# Patient Record
Sex: Female | Born: 1980 | Race: Black or African American | Hispanic: No | Marital: Single | Smoking: Current every day smoker
Health system: Southern US, Community
[De-identification: ages and names within clinical notes are randomized; demographics above are authoritative.]

## PROBLEM LIST (undated history)

## (undated) DIAGNOSIS — B009 Herpesviral infection, unspecified: Secondary | ICD-10-CM

## (undated) HISTORY — DX: Herpesviral infection, unspecified: B00.9

---

## 2004-08-22 ENCOUNTER — Ambulatory Visit (HOSPITAL_COMMUNITY): Admission: RE | Admit: 2004-08-22 | Discharge: 2004-08-22 | Payer: Self-pay | Admitting: Internal Medicine

## 2004-12-02 ENCOUNTER — Encounter: Admission: RE | Admit: 2004-12-02 | Discharge: 2004-12-25 | Payer: Self-pay | Admitting: *Deleted

## 2005-01-18 ENCOUNTER — Ambulatory Visit: Payer: Self-pay | Admitting: Certified Nurse Midwife

## 2005-01-18 ENCOUNTER — Inpatient Hospital Stay (HOSPITAL_COMMUNITY): Admission: AD | Admit: 2005-01-18 | Discharge: 2005-01-20 | Payer: Self-pay | Admitting: Family Medicine

## 2006-03-15 ENCOUNTER — Emergency Department (HOSPITAL_COMMUNITY): Admission: EM | Admit: 2006-03-15 | Discharge: 2006-03-15 | Payer: Self-pay | Admitting: Emergency Medicine

## 2006-07-15 IMAGING — US US OB COMP +14 WK
1 series · 13 of 28 positions shown · non-contrast
Comparison: none

CLINICAL DATA: 18 week 0 day gestational age by LMP.  Evaluate anatomy and dating.

[Series 1: us ob comp +14 wk · 0.20mm/px · 13 of 91 slices shown]
[im 4/91]
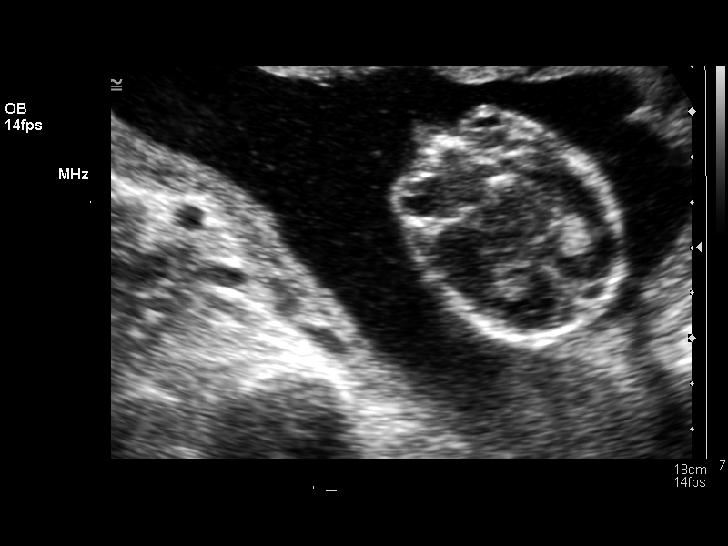
[im 11/91]
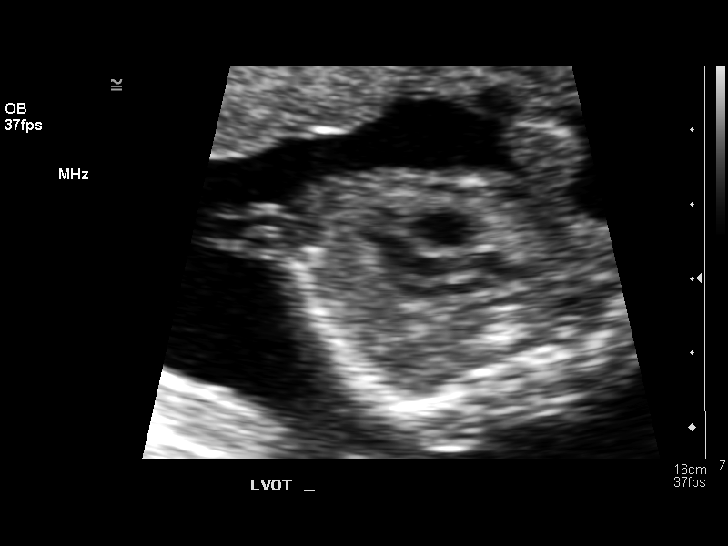
[im 17/91]
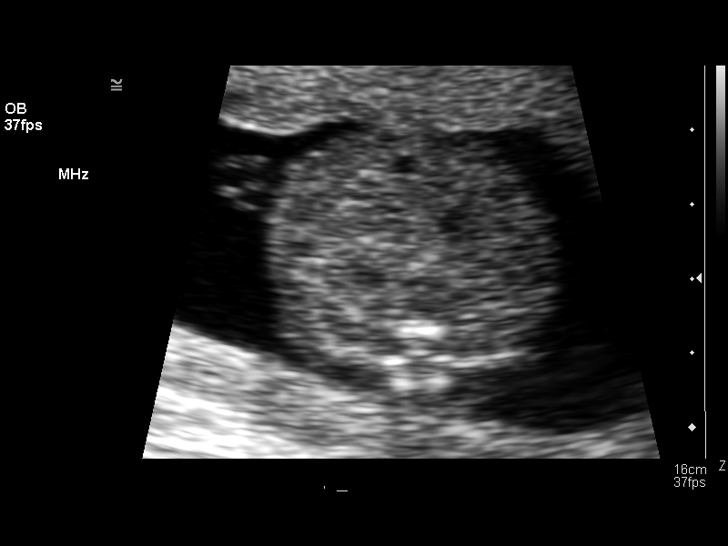
[im 24/91]
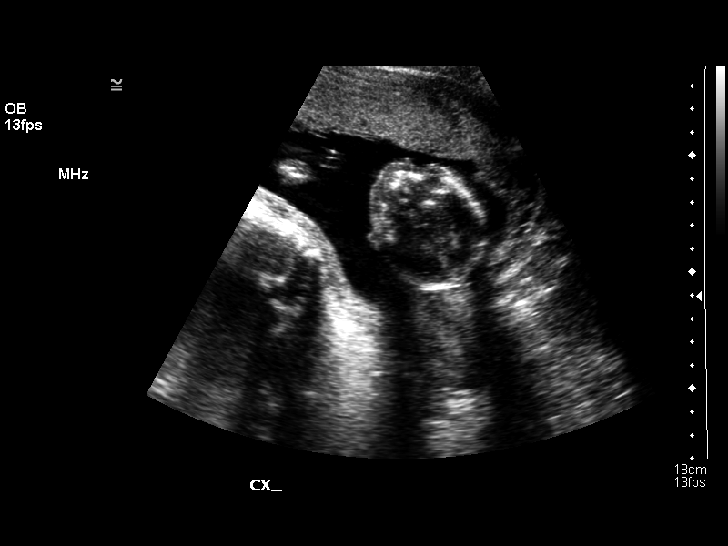
[im 31/91]
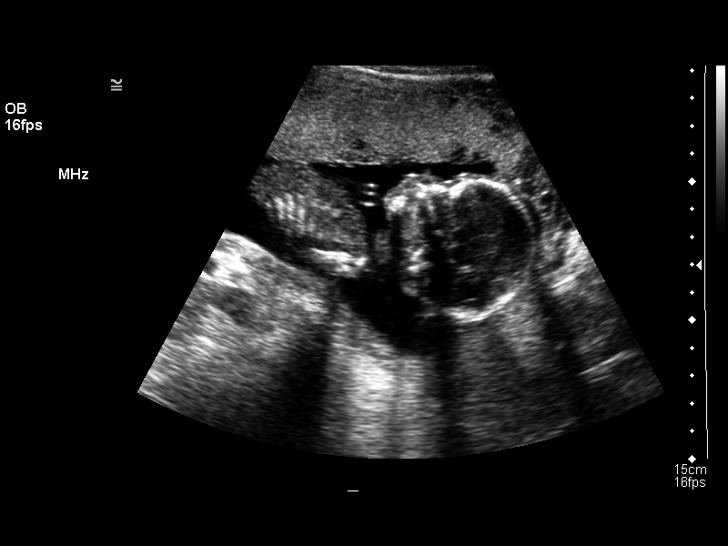
[im 37/91]
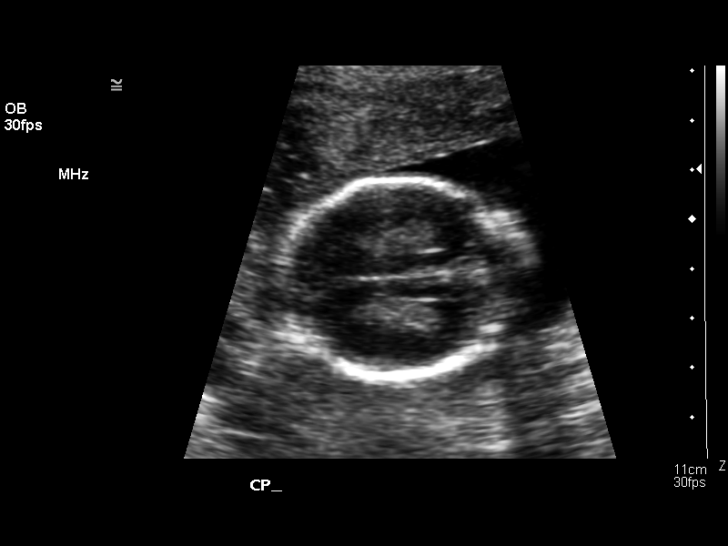
[im 47/91]
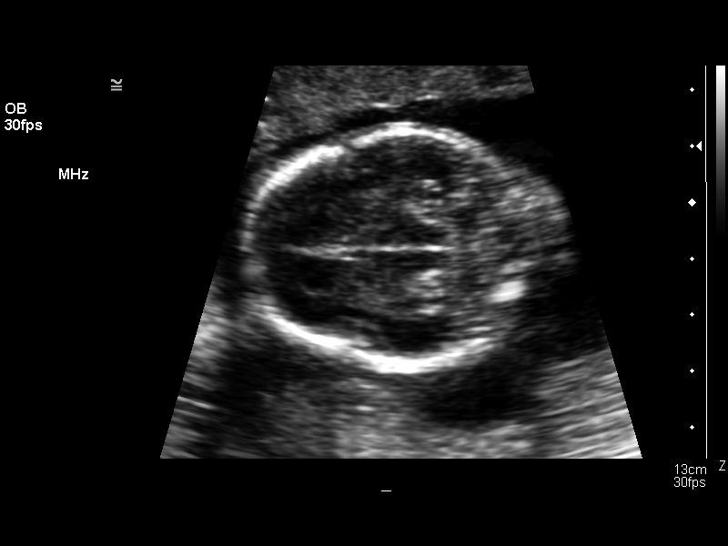
[im 54/91]
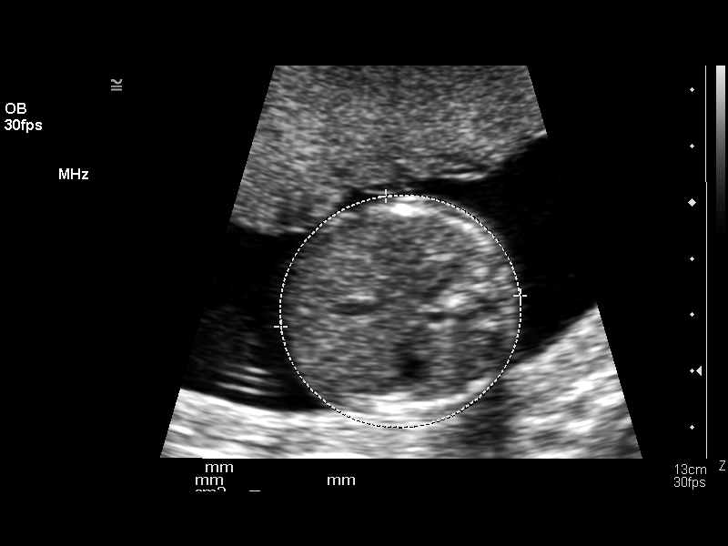
[im 61/91]
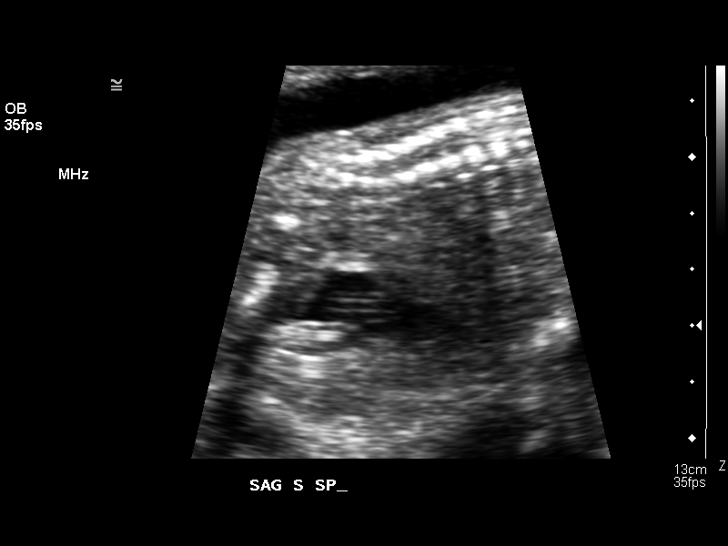
[im 67/91]
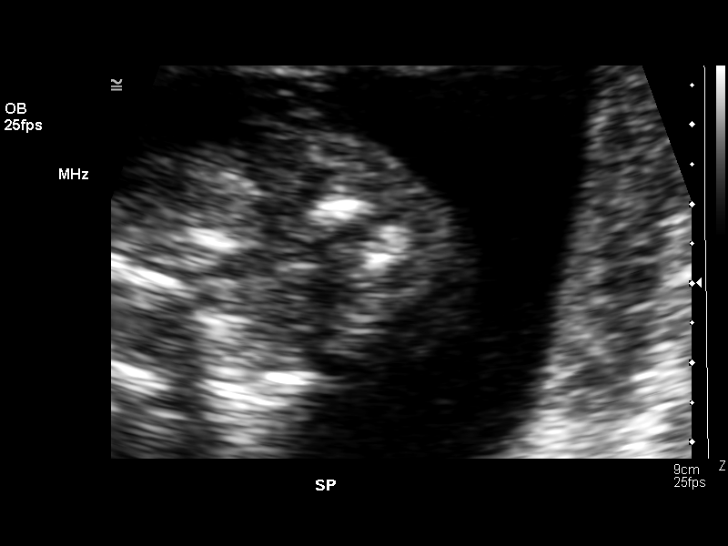
[im 74/91]
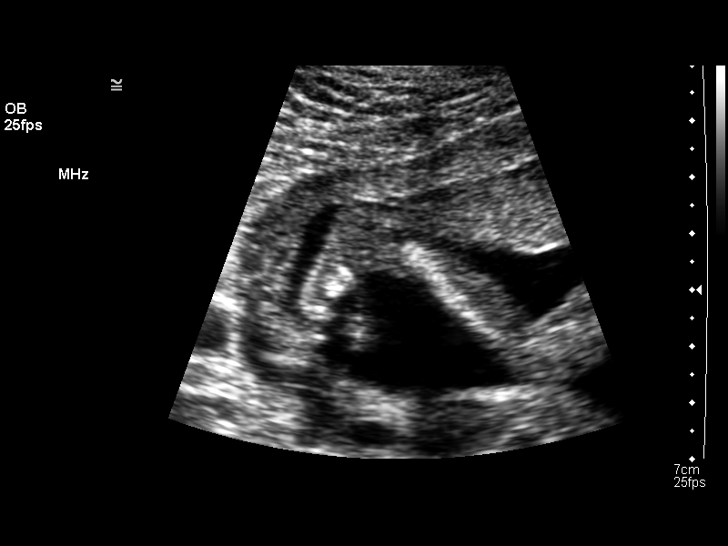
[im 81/91]
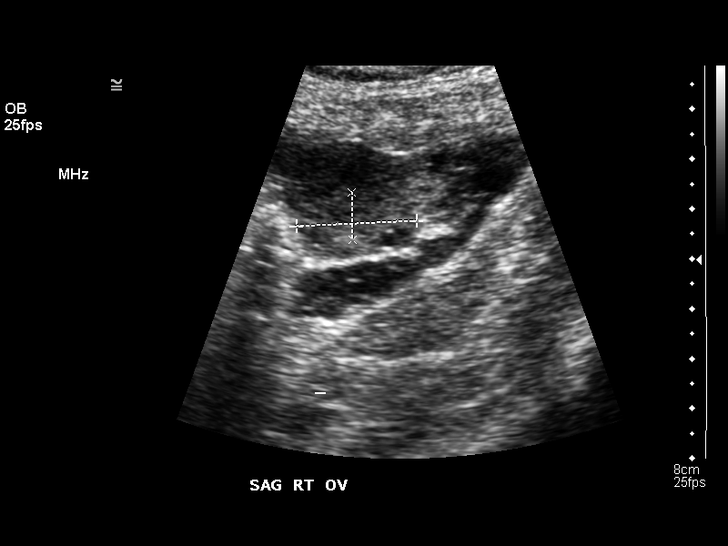
[im 87/91]
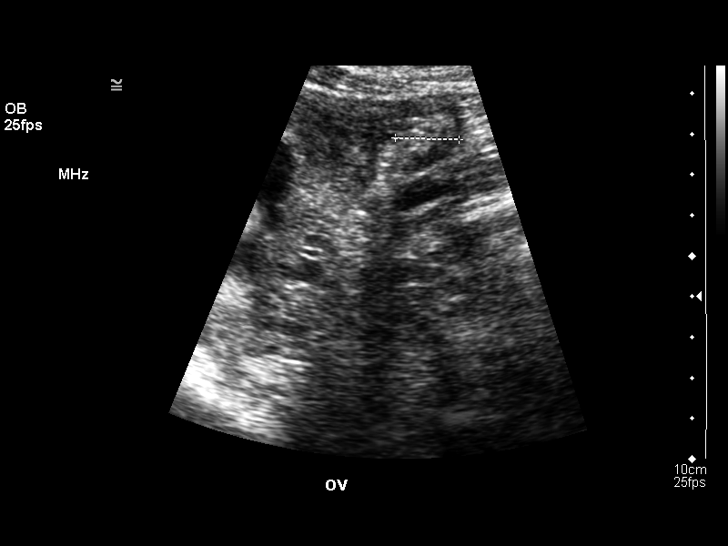

[13 of 28 positions shown; findings below may reference images not displayed]

OBSTETRICAL ULTRASOUND:
 Number of Fetuses:  1
 Heart Rate:  147
 Movement:  Yes
 Breathing:  Yes  
 Presentation:  Cephalic
 Placental Location:  Anterior
 Grade:  I
 Previa:  No
 Amniotic Fluid (Subjective):  Normal
 Amniotic Fluid (Objective):   3.3 cm Vertical pocket 

 FETAL BIOMETRY
 BPD:   4.2 cm   18 w 4 d
 HC:   15.8 cm   18 w 5 d
 AC:   13.1 cm   18 w 4 d
 FL:    3.0 cm   19 w 2 d

 MEAN GA:  18 w 6 d

 FETAL ANATOMY
 Lateral Ventricles:    Visualized 
 Thalami/CSP:      Visualized 
 Posterior Fossa:  Visualized 
 Nuchal Region:    Visualized 
 Spine:      Visualized 
 4 Chamber Heart on Left:      Visualized 
 Stomach on Left:      Visualized 
 3 Vessel Cord:    Visualized 
 Cord Insertion site:    Visualized 
 Kidneys:  Visualized 
 Bladder:  Visualized 
 Extremities:      Visualized 

 ADDITIONAL ANATOMY VISUALIZED:  LVOT, RVOT, upper lip, orbits, profile, diaphragm, heel, 5th digit, ductal arch, aortic arch, and female genitalia.

 MATERNAL UTERINE AND ADNEXAL FINDINGS
 Cervix:   3.4 cm Transabdominally.  
 Both ovaries are unremarkable in appearance.  Incidental note is made of a small, approximately 1 cm, right ovarian corpus luteum cyst.
IMPRESSION: 1.  Single living intrauterine fetus with mean gestational age of 18 weeks 6 days and sonographic EDC 01/17/05.  This is within one week of LMP.  
 2.  No evidence of fetal anatomic abnormality.

## 2010-12-15 ENCOUNTER — Emergency Department (HOSPITAL_COMMUNITY)
Admission: EM | Admit: 2010-12-15 | Discharge: 2010-12-16 | Disposition: A | Discharge status: Home / Self Care | Payer: Self-pay | Attending: Emergency Medicine | Admitting: Emergency Medicine

## 2010-12-15 DIAGNOSIS — R319 Hematuria, unspecified: Secondary | ICD-10-CM | POA: Insufficient documentation

## 2010-12-15 DIAGNOSIS — R109 Unspecified abdominal pain: Secondary | ICD-10-CM | POA: Insufficient documentation

## 2010-12-15 DIAGNOSIS — B9689 Other specified bacterial agents as the cause of diseases classified elsewhere: Secondary | ICD-10-CM | POA: Insufficient documentation

## 2010-12-15 DIAGNOSIS — N739 Female pelvic inflammatory disease, unspecified: Secondary | ICD-10-CM | POA: Insufficient documentation

## 2010-12-15 DIAGNOSIS — A499 Bacterial infection, unspecified: Secondary | ICD-10-CM | POA: Insufficient documentation

## 2010-12-15 DIAGNOSIS — R599 Enlarged lymph nodes, unspecified: Secondary | ICD-10-CM | POA: Insufficient documentation

## 2010-12-15 DIAGNOSIS — B3731 Acute candidiasis of vulva and vagina: Secondary | ICD-10-CM | POA: Insufficient documentation

## 2010-12-15 DIAGNOSIS — N760 Acute vaginitis: Secondary | ICD-10-CM | POA: Insufficient documentation

## 2010-12-15 DIAGNOSIS — B373 Candidiasis of vulva and vagina: Secondary | ICD-10-CM | POA: Insufficient documentation

## 2010-12-15 DIAGNOSIS — N949 Unspecified condition associated with female genital organs and menstrual cycle: Secondary | ICD-10-CM | POA: Insufficient documentation

## 2010-12-15 LAB — DIFFERENTIAL
Basophils Relative: 1 % (ref 0–1)
Eosinophils Relative: 1 % (ref 0–5)
Lymphocytes Relative: 32 % (ref 12–46)
Lymphs Abs: 2.3 10*3/uL (ref 0.7–4.0)
Monocytes Relative: 10 % (ref 3–12)
Neutro Abs: 4.1 10*3/uL (ref 1.7–7.7)
Neutrophils Relative %: 56 % (ref 43–77)

## 2010-12-15 LAB — POCT I-STAT, CHEM 8
BUN: 14 mg/dL (ref 6–23)
Calcium, Ion: 1.14 mmol/L (ref 1.12–1.32)
Creatinine, Ser: 0.8 mg/dL (ref 0.50–1.10)
HCT: 41 % (ref 36.0–46.0)
Hemoglobin: 13.9 g/dL (ref 12.0–15.0)

## 2010-12-15 LAB — CBC
Hemoglobin: 12.6 g/dL (ref 12.0–15.0)
MCH: 27.1 pg (ref 26.0–34.0)
RDW: 12.3 % (ref 11.5–15.5)

## 2010-12-15 LAB — URINALYSIS, ROUTINE W REFLEX MICROSCOPIC
Glucose, UA: NEGATIVE mg/dL
Hgb urine dipstick: NEGATIVE
Ketones, ur: NEGATIVE mg/dL
Nitrite: NEGATIVE
pH: 6.5 (ref 5.0–8.0)

## 2010-12-15 LAB — URINE MICROSCOPIC-ADD ON

## 2011-07-28 ENCOUNTER — Ambulatory Visit: Payer: Self-pay | Admitting: Emergency Medicine

## 2011-07-28 VITALS — BP 107/68 | HR 75 | Temp 98.1°F | Resp 18 | Ht 65.5 in | Wt 160.0 lb

## 2011-07-28 DIAGNOSIS — J018 Other acute sinusitis: Secondary | ICD-10-CM

## 2011-07-28 DIAGNOSIS — J40 Bronchitis, not specified as acute or chronic: Secondary | ICD-10-CM

## 2011-07-28 MED ORDER — HYDROCOD POLST-CHLORPHEN POLST 10-8 MG/5ML PO LQCR: 5.0000 mL | Freq: Two times a day (BID) | ORAL | Status: DC | PRN

## 2011-07-28 MED ORDER — LEVOFLOXACIN 500 MG PO TABS: 500.0000 mg | ORAL_TABLET | Freq: Every day | ORAL | Status: AC

## 2011-07-28 NOTE — Progress Notes (Signed)
  Subjective:    Patient ID: Yolanda Coleman, female    DOB: 02-09-1981, 31 y.o.   MRN: [id number]  Sinusitis This is a new problem. The current episode started in the past 7 days. The problem has been gradually worsening since onset. The maximum temperature recorded prior to her arrival was 100 - 100.9 F. The fever has been present for less than 1 day. Her pain is at a severity of 3/10. The pain is mild. Associated symptoms include congestion, coughing, sinus pressure and a sore throat. Pertinent negatives include no chills, diaphoresis, ear pain, hoarse voice, neck pain, shortness of breath, sneezing or swollen glands. Past treatments include nothing.      Review of Systems  Constitutional: Negative for chills and diaphoresis.  HENT: Positive for congestion, sore throat, postnasal drip and sinus pressure. Negative for ear pain, hoarse voice, sneezing and neck pain.   Eyes: Negative.   Respiratory: Positive for cough. Negative for shortness of breath.   Cardiovascular: Negative.   Gastrointestinal: Negative.   Musculoskeletal: Negative.   Neurological: Negative.   Psychiatric/Behavioral: Negative.        Objective:   Physical Exam  Constitutional: She is oriented to person, place, and time. She appears well-developed and well-nourished.  HENT:  Head: Normocephalic and atraumatic.  Right Ear: External ear normal.  Left Ear: External ear normal.  Eyes: Conjunctivae are normal. Pupils are equal, round, and reactive to light.  Neck: Normal range of motion. Neck supple.  Cardiovascular: Normal rate, regular rhythm and normal heart sounds.   Pulmonary/Chest: Effort normal and breath sounds normal. She has no rales.  Abdominal: Soft.  Musculoskeletal: Normal range of motion.  Lymphadenopathy:    She has no cervical adenopathy.  Neurological: She is alert and oriented to person, place, and time.  Skin: Skin is warm and dry.          Assessment & Plan:

## 2011-07-28 NOTE — Patient Instructions (Signed)

## 2017-12-21 ENCOUNTER — Ambulatory Visit (INDEPENDENT_AMBULATORY_CARE_PROVIDER_SITE_OTHER): Payer: 59 | Admitting: Family Medicine

## 2017-12-21 ENCOUNTER — Encounter: Payer: Self-pay | Admitting: Family Medicine

## 2017-12-21 VITALS — BP 106/71 | HR 66 | Temp 98.6°F | Ht 67.0 in | Wt 151.0 lb

## 2017-12-21 DIAGNOSIS — Z8342 Family history of familial hypercholesterolemia: Secondary | ICD-10-CM

## 2017-12-21 DIAGNOSIS — Z823 Family history of stroke: Secondary | ICD-10-CM | POA: Insufficient documentation

## 2017-12-21 DIAGNOSIS — Z720 Tobacco use: Secondary | ICD-10-CM | POA: Insufficient documentation

## 2017-12-21 DIAGNOSIS — Z7689 Persons encountering health services in other specified circumstances: Secondary | ICD-10-CM

## 2017-12-21 DIAGNOSIS — Z8249 Family history of ischemic heart disease and other diseases of the circulatory system: Secondary | ICD-10-CM | POA: Diagnosis not present

## 2017-12-21 DIAGNOSIS — Z8349 Family history of other endocrine, nutritional and metabolic diseases: Secondary | ICD-10-CM

## 2017-12-21 DIAGNOSIS — Z801 Family history of malignant neoplasm of trachea, bronchus and lung: Secondary | ICD-10-CM

## 2017-12-21 NOTE — Progress Notes (Signed)
New patient office visit note:  Impression and Recommendations:    1. Encounter to establish care with new doctor   2. Family history of lung cancer   3. Family history of high cholesterol   4. Family history of valvular heart disease   5. Family history of thyroid disease   6. Family history of stroke or transient ischemic attack in mother   18. Current nicotine use- vapes currently -  quit cigarettes 3 years ago and prior had only a 5-pack-year history     Encounter to establish care with new doctor - She is a very healthy 37 yo woman.  - We discussed ways to maintain her healthy weight - Follow up yearly for physical and labs -  d/c pt our philosophy; her expectations in regards to her care etc   Education and routine counseling performed. Handouts provided.   Gross side effects, risk and benefits, and alternatives of medications discussed with patient.  Patient is aware that all medications have potential side effects and we are unable to predict every side effect or drug-drug interaction that may occur.  Expresses verbal understanding and consents to current therapy plan and treatment regimen.  Return for CPE/ yrly physical, come fasting.  Please see AVS handed out to patient at the end of our visit for further patient instructions/ counseling done pertaining to today's office visit.    Note:  This document was prepared using Dragon voice recognition software and may include unintentional dictation errors.   This document serves as a record of services personally performed by Thomasene Lot, DO. It was created on her behalf by Mickie Bail, a trained medical scribe. The creation of this record is based on the scribe's personal observations and the provider's statements to them.   I have reviewed the above medical documentation for completeness.  Thomasene Lot,  D.O.     ---------------------------------------------------------------------------------------------------------------------------------------------------------------------------------------------    Subjective:    Chief complaint:   Chief Complaint  Patient presents with  . Establish Care     HPI: Yolanda Coleman is a pleasant 37 y.o. female who presents to [facility] today to review their medical history with me and establish care. She works for [employer]. She's lived in [place] since she attended [employer] 19 years ago. She is currently engaged-- fiance has two daughters and she has one daughter. They've been together 3 years, wedding planned for 11/26/2018. She is a former cigarette smoker, currently vapes 12-24% nicotine. She runs twice a week.  I asked the patient to review their chronic problem list with me to ensure everything was updated and accurate.    All recent office visits with other providers, any medical records that patient brought in etc  - I reviewed today.     We asked pt to get [place] their medical records from [facility] providers/ specialists that they had seen within the past 3-5 years- if they are in private practice and/or do not work for [employer], [facility], [facility], [facility] or [facility] owned practice.  Told them to call their specialists to clarify this if they are not sure.     Wt Readings from Last 3 Encounters:  12/21/17 151 lb (68.5 kg)  07/28/11 160 lb (72.6 kg)   BP Readings from Last 3 Encounters:  12/21/17 106/71  07/28/11 107/68   Pulse Readings from Last 3 Encounters:  12/21/17 66  07/28/11 75   BMI Readings from Last  3 Encounters:  12/21/17 23.65 kg/m  07/28/11 26.22 kg/m    Patient Care Team    Relationship Specialty Notifications Start End  Thomasene Lot, DO PCP - General Family Medicine  12/21/17   Ginette Otto, [facility]    12/21/17     Patient Active Problem List    Diagnosis Date Noted  . Encounter to establish care with new doctor 12/21/2017  . Family history of lung cancer 12/21/2017  . Family history of valvular heart disease 12/21/2017  . Family history of high cholesterol 12/21/2017  . Family history of thyroid disease 12/21/2017  . Family history of stroke or transient ischemic attack in mother 12/21/2017  . Current nicotine use- vapes currently -  quit cigarettes 3 years ago and prior had only a 5-pack-year history 12/21/2017       As reported by pt:  Past Medical History:  Diagnosis Date  . Herpes    type 2     History reviewed. No pertinent surgical history.   Family History  Problem Relation Age of Onset  . Lung cancer Mother   . High Cholesterol Mother   . Stroke Mother   . Thyroid disease Maternal Uncle   . Thyroid disease Maternal Aunt      Social History   Substance and Sexual Activity  Drug Use Never     Social History   Substance and Sexual Activity  Alcohol Use Yes  . Alcohol/week: 5.0 - 7.0 standard drinks  . Types: 5 - 7 Standard drinks or equivalent per week     Social History   Tobacco Use  Smoking Status Current Every Day Smoker  . Types: E-cigarettes  Smokeless Tobacco Never Used  Tobacco Comment   electronic cigarettes - patient smoked cigs .5 ppd for 10 years quit 3 years ago      Current Meds  Medication Sig  . valACYclovir (VALTREX) 500 MG tablet     Allergies: Penicillins   Review of Systems  Constitutional: Negative for fever and weight loss.  HENT: Positive for congestion. Negative for hearing loss and tinnitus.   Eyes: Negative for blurred vision and double vision.  Respiratory: Negative for cough and wheezing.   Cardiovascular: Negative for chest pain and palpitations.  Gastrointestinal: Negative for blood in stool, diarrhea, nausea and vomiting.  Musculoskeletal: Negative for joint pain and myalgias.  Skin: Negative for rash.  Neurological: Negative for weakness and  headaches.  Endo/Heme/Allergies: Negative for environmental allergies. Does not bruise/bleed easily.  Psychiatric/Behavioral: Negative for depression and memory loss. The patient is not nervous/anxious and does not have insomnia.         Objective:   Blood pressure 106/71, pulse 66, temperature 98.6 F (37 C), height 5\' 7"  (1.702 m), weight 151 lb (68.5 kg), SpO2 99 %. Body mass index is 23.65 kg/m. General: Well Developed, well nourished, and in no acute distress.  Neuro: Alert and oriented x3, extra-ocular muscles intact, sensation grossly intact.  HEENT:[place]/AT, PERRLA, neck supple, No carotid bruits Skin: no gross rashes  Cardiac: Regular rate and rhythm Respiratory: Essentially clear to auscultation bilaterally. Not using accessory muscles, speaking in full sentences.  Abdominal: not grossly distended Musculoskeletal: Ambulates w/o diff, FROM * 4 ext.  Vasc: less 2 sec cap RF, warm and pink  Psych:  No HI/SI, judgement and insight good, Euthymic mood. Full Affect.    No results found for this or any previous visit (from the past 2160 hour(s)).

## 2017-12-21 NOTE — Patient Instructions (Addendum)

## 2018-03-03 ENCOUNTER — Encounter: Payer: 59 | Admitting: Family Medicine

## 2018-12-02 ENCOUNTER — Encounter: Payer: Self-pay | Admitting: Family Medicine

## 2018-12-02 ENCOUNTER — Other Ambulatory Visit: Payer: Self-pay

## 2018-12-02 ENCOUNTER — Ambulatory Visit (INDEPENDENT_AMBULATORY_CARE_PROVIDER_SITE_OTHER): Payer: No Typology Code available for payment source | Admitting: Family Medicine

## 2018-12-02 DIAGNOSIS — R300 Dysuria: Secondary | ICD-10-CM

## 2018-12-02 DIAGNOSIS — N941 Unspecified dyspareunia: Secondary | ICD-10-CM | POA: Diagnosis not present

## 2018-12-02 LAB — POCT URINALYSIS DIPSTICK OB
Bilirubin, UA: NEGATIVE
Glucose, UA: NEGATIVE
Ketones, UA: NEGATIVE
Leukocytes, UA: NEGATIVE
Nitrite, UA: NEGATIVE
POC,PROTEIN,UA: NEGATIVE
Spec Grav, UA: 1.025 (ref 1.010–1.025)
Urobilinogen, UA: 0.2 E.U./dL
pH, UA: 7 (ref 5.0–8.0)

## 2018-12-02 NOTE — Progress Notes (Signed)
Impression and Recommendations:    1. Dysuria   2. Dyspareunia in female     Dysuria, Dyspareunia - Urinalysis drawn today; showed trace blood as only finding.  - Reviewed findings of urinalysis with patient today and educated on findings- likely NOT a UTI; pt feels relieved. - Urine sent for culture today.   - Encouraged patient to follow-up with OBGYN for further assessment of reported right sided ovarian-type cramping and pains, and pain during intercourse.  Extensive education provided and all questions answered.  - Lengthy conversation held with patient during appointment today, regarding symptoms. - Reviewed that if other symptoms emerge, such as back pain or blood in the urine, patient should call in for further evaluation.   Education and routine counseling performed. Handouts provided.  Recommendations - Return for CPE in near future.  - As part of my medical decision making, I reviewed the following data within the electronic MEDICAL RECORD NUMBER History obtained from pt /family, CMA notes reviewed and incorporated if applicable, Labs reviewed, Radiograph/ tests reviewed if applicable and OV notes from prior OV's with me, as well as other specialists she/he has seen since seeing me last, were all reviewed and used in my medical decision making process today.    - Additionally, discussion had with patient regarding our treatment plan, and their biases/concerns about that plan were used in my medical decision making today.    - The patient agreed with the plan and demonstrated an understanding of the instructions.   No barriers to understanding were identified.    - Red flag symptoms and signs discussed in detail.  Patient expressed understanding regarding what to do in case of emergency\ urgent symptoms.   - The patient was advised to call back or seek an in-person evaluation if the symptoms worsen or if the condition fails to improve as anticipated.     Orders Placed This Encounter  Procedures  . Urine Culture  . POC Urinalysis Dipstick OB    The patient was counseled, risk factors were discussed, anticipatory guidance given.  Gross side effects, risk and benefits, and alternatives of medications discussed with patient.  Patient is aware that all medications have potential side effects and we are unable to predict every side effect or drug-drug interaction that may occur.  Expresses verbal understanding and consents to current therapy plan and treatment regimen.   Return for CPE/ yrly physical near future, come fasting for bldwrk.  I provided 11 minutes of non face-to-face time during this encounter.  Additional time was spent with charting and coordination of care after the actual visit commenced.   Note:  This note was prepared with assistance of Dragon voice recognition software. Occasional wrong-word or sound-a-like substitutions may have occurred due to the inherent limitations of voice recognition software.  This document serves as a record of services personally performed by Thomasene Lot, DO. It was created on her behalf by Peggye Fothergill, a trained medical scribe. The creation of this record is based on the scribe's personal observations and the provider's statements to them.   I have reviewed the above medical documentation for accuracy and completeness and I concur.  Thomasene Lot, DO 12/03/2018 7:29 PM       Subjective:    HPI: Yolanda Coleman is a 38 y.o. female who presents to [facility] at [facility] today for c/o pain during urination.  Thinks her symptoms started about a month ago, with "a little  bit of sharp pains in my lower abdomen near the ovary area."  She thought maybe it was just something to do with ovulation.  "It wasn't very frequent."  Then about two weeks ago, she went to pee, and felt a "sharp pain on my right side, right above the hipbone, or like right next to the  hipbone, when I peed."  Had 3-4 days of "sharp pain over on that right side of my hip only when I urinated."  Sometimes later in the day she would have "a little bit of discomfort, not painful to the touch, but more like super super mild cramps and a couple of times there would be discomfort during intercourse so we would just stop."  "I thought for a second maybe it was kidney stones trying to pass through."  Denies: Denies burning, denies itching or anything "that would make me think it was out of my urethra or anything like that hurting."  - Improvement "I actually feel much better now."  Says it's been about 2-3 days since she's had any discomfort or "just not feeling right."  States last Friday was the last time she felt the sharp pain in the side hip area during urination.  "Sometimes I just have that weird discomfort during intercourse or afterwards where I'm just not feeling normal."  Restates that she's felt much better the past two days.  Has an annual pap / OBGYN visit scheduled in December.    Urinalysis    Component Value Date/Time   COLORURINE YELLOW 12/15/2010 2115   APPEARANCEUR CLEAR 12/15/2010 2115   LABSPEC 1.019 12/15/2010 2115   PHURINE 6.5 12/15/2010 2115   GLUCOSEU Negative 12/02/2018 1025   HGBUR NEGATIVE 12/15/2010 2115   BILIRUBINUR neg 12/02/2018 [place] 12/15/2010 2115   PROTEINUR NEGATIVE 12/15/2010 2115   UROBILINOGEN 0.2 12/02/2018 1025   UROBILINOGEN 0.2 12/15/2010 2115   NITRITE negative 12/02/2018 1025   NITRITE NEGATIVE 12/15/2010 2115   LEUKOCYTESUR Negative 12/02/2018 1025    Wt Readings from Last 3 Encounters:  12/21/17 151 lb (68.5 kg)  07/28/11 160 lb (72.6 kg)   BP Readings from Last 3 Encounters:  12/21/17 106/71  07/28/11 107/68   Pulse Readings from Last 3 Encounters:  12/21/17 66  07/28/11 75   BMI Readings from Last 3 Encounters:  12/21/17 23.65 kg/m  07/28/11 26.22 kg/m     Patient Active Problem  List   Diagnosis Date Noted  . Dyspareunia in female 12/02/2018  . Encounter to establish care with new doctor 12/21/2017  . Family history of lung cancer 12/21/2017  . Family history of valvular heart disease 12/21/2017  . Family history of high cholesterol 12/21/2017  . Family history of thyroid disease 12/21/2017  . Family history of stroke or transient ischemic attack in mother 12/21/2017  . Current nicotine use- vapes currently -  quit cigarettes 3 years ago and prior had only a 5-pack-year history 12/21/2017    History reviewed. No pertinent surgical history.  Family History  Problem Relation Age of Onset  . Lung cancer Mother   . High Cholesterol Mother   . Stroke Mother   . Thyroid disease Maternal Uncle   . Thyroid disease Maternal Aunt     Social History   Substance and Sexual Activity  Drug Use Never  ,  Social History   Substance and Sexual Activity  Alcohol Use Yes  . Alcohol/week: 5.0 - 7.0 standard drinks  . Types: 5 - 7 Standard  drinks or equivalent per week  ,  Social History   Tobacco Use  Smoking Status Current Every Day Smoker  . Types: E-cigarettes  Smokeless Tobacco Never Used  Tobacco Comment   electronic cigarettes - patient smoked cigs .5 ppd for 10 years quit 3 years ago   ,  Social History   Substance and Sexual Activity  Sexual Activity Yes  . Partners: Male  . Birth control/protection: None    Patient's Medications  New Prescriptions   No medications on file  Previous Medications   VALACYCLOVIR (VALTREX) 500 MG TABLET      Modified Medications   No medications on file  Discontinued Medications   No medications on file    Penicillins  No outpatient medications have been marked as taking for the 12/02/18 encounter (Office Visit) with Thomasene Lotpalski, Donise Woodle, DO.    Review of Systems: General:   No F/C, wt loss Pulm:   No DIB, pleuritic chest pain Card:  No CP, palpitations Abd:  No n/v/d or pain GU:  Dysuria, increased  frequency and urgency; no vaginal discharge Ext:  No inc edema from baseline   Objective:  There were no vitals taken for this visit. There is no height or weight on file to calculate BMI.  General: Well Developed, well nourished, and in no acute distress.  HEENT: Normocephalic, atraumatic Skin: Warm and dry, cap RF less 2 sec, good turgor CV: +S1, S2 Respiratory: ECTA B/L; speaking in full sentences, no conversational dyspnea Abd: Soft, NT, ND, No G/R/R, no SPT, No flank pain NeuroM-Sk: Ambulates w/o assistance, moves * 4 Psych: A and O *3

## 2018-12-04 LAB — URINE CULTURE: Organism ID, Bacteria: NO GROWTH

## 2019-03-21 ENCOUNTER — Ambulatory Visit: Payer: No Typology Code available for payment source | Admitting: Family Medicine

## 2019-03-24 ENCOUNTER — Encounter: Payer: Self-pay | Admitting: Family Medicine

## 2019-03-24 ENCOUNTER — Ambulatory Visit (INDEPENDENT_AMBULATORY_CARE_PROVIDER_SITE_OTHER): Payer: No Typology Code available for payment source | Admitting: Family Medicine

## 2019-03-24 ENCOUNTER — Other Ambulatory Visit: Payer: Self-pay

## 2019-03-24 VITALS — BP 112/78 | HR 79 | Temp 98.5°F | Resp 12 | Ht 67.0 in | Wt 151.1 lb

## 2019-03-24 DIAGNOSIS — H6983 Other specified disorders of Eustachian tube, bilateral: Secondary | ICD-10-CM | POA: Diagnosis not present

## 2019-03-24 DIAGNOSIS — H7393 Unspecified disorder of tympanic membrane, bilateral: Secondary | ICD-10-CM

## 2019-03-24 DIAGNOSIS — H9193 Unspecified hearing loss, bilateral: Secondary | ICD-10-CM

## 2019-03-24 MED ORDER — FLUTICASONE PROPIONATE 50 MCG/ACT NA SUSP: NASAL | 2 refills | Status: AC

## 2019-03-24 NOTE — Patient Instructions (Addendum)
Please return in very near future (mid April) for complete physical examination and full fasting blood work, as we have never obtained a physical or bldwrk on you in the past.  Begin AYR or Neilmed sinus rinse flushes in the morning, both sides, and then one spray Flonase each nostril in the morning, and the same thing at night.      Eustachian Tube Dysfunction  Eustachian tube dysfunction refers to a condition in which a blockage develops in the narrow passage that connects the middle ear to the back of the nose (eustachian tube). The eustachian tube regulates air pressure in the middle ear by letting air move between the ear and nose. It also helps to drain fluid from the middle ear space. Eustachian tube dysfunction can affect one or both ears. When the eustachian tube does not function properly, air pressure, fluid, or both can build up in the middle ear. What are the causes? This condition occurs when the eustachian tube becomes blocked or cannot open normally. Common causes of this condition include:  Ear infections.  Colds and other infections that affect the nose, mouth, and throat (upper respiratory tract).  Allergies.  Irritation from cigarette smoke.  Irritation from stomach acid coming up into the esophagus (gastroesophageal reflux). The esophagus is the tube that carries food from the mouth to the stomach.  Sudden changes in air pressure, such as from descending in an airplane or scuba diving.  Abnormal growths in the nose or throat, such as: ? Growths that line the nose (nasal polyps). ? Abnormal growth of cells (tumors). ? Enlarged tissue at the back of the throat (adenoids). What increases the risk? You are more likely to develop this condition if:  You smoke.  You are overweight.  You are a child who has: ? Certain birth defects of the mouth, such as cleft palate. ? Large tonsils or adenoids. What are the signs or symptoms? Common symptoms of this condition  include:  A feeling of fullness in the ear.  Ear pain.  Clicking or popping noises in the ear.  Ringing in the ear.  Hearing loss.  Loss of balance.  Dizziness. Symptoms may get worse when the air pressure around you changes, such as when you travel to an area of high elevation, fly on an airplane, or go scuba diving. How is this diagnosed? This condition may be diagnosed based on:  Your symptoms.  A physical exam of your ears, nose, and throat.  Tests, such as those that measure: ? The movement of your eardrum (tympanogram). ? Your hearing (audiometry). How is this treated? Treatment depends on the cause and severity of your condition.  In mild cases, you may relieve your symptoms by moving air into your ears. This is called "popping the ears."  In more severe cases, or if you have symptoms of fluid in your ears, treatment may include: ? Medicines to relieve congestion (decongestants). ? Medicines that treat allergies (antihistamines). ? Nasal sprays or ear drops that contain medicines that reduce swelling (steroids). ? A procedure to drain the fluid in your eardrum (myringotomy). In this procedure, a small tube is placed in the eardrum to:  Drain the fluid.  Restore the air in the middle ear space. ? A procedure to insert a balloon device through the nose to inflate the opening of the eustachian tube (balloon dilation). Follow these instructions at home: Lifestyle  Do not do any of the following until your health care provider approves: ? Travel to  high altitudes. ? Fly in airplanes. ? Work in a Estate agent or room. ? Scuba dive.  Do not use any products that contain nicotine or tobacco, such as cigarettes and e-cigarettes. If you need help quitting, ask your health care provider.  Keep your ears dry. Wear fitted earplugs during showering and bathing. Dry your ears completely after. General instructions  Take over-the-counter and prescription medicines  only as told by your health care provider.  Use techniques to help pop your ears as recommended by your health care provider. These may include: ? Chewing gum. ? Yawning. ? Frequent, forceful swallowing. ? Closing your mouth, holding your nose closed, and gently blowing as if you are trying to blow air out of your nose.  Keep all follow-up visits as told by your health care provider. This is important. Contact a health care provider if:  Your symptoms do not go away after treatment.  Your symptoms come back after treatment.  You are unable to pop your ears.  You have: ? A fever. ? Pain in your ear. ? Pain in your head or neck. ? Fluid draining from your ear.  Your hearing suddenly changes.  You become very dizzy.  You lose your balance. Summary  Eustachian tube dysfunction refers to a condition in which a blockage develops in the eustachian tube.  It can be caused by ear infections, allergies, inhaled irritants, or abnormal growths in the nose or throat.  Symptoms include ear pain, hearing loss, or ringing in the ears.  Mild cases are treated with maneuvers to unblock the ears, such as yawning or ear popping.  Severe cases are treated with medicines. Surgery may also be done (rare). This information is not intended to replace advice given to you by your health care provider. Make sure you discuss any questions you have with your health care provider. Document Revised: 05/25/2017 Document Reviewed: 05/25/2017 Elsevier Patient Education  2020 [employer].

## 2019-03-24 NOTE — Progress Notes (Signed)
Impression and Recommendations:    1. Hearing deficit, bilateral   2. ETD (Eustachian tube dysfunction), bilateral   3. Tympanic membrane irritation/ buldge, bilateral       Hearing Deficit, Bilateral - ETD, Tympanic Membrane Irritation/Bulge, Bilateral - Education provided to patient today regarding ear health and hearing. - Discussed possible causes of hearing loss, and all questions answered.  -Hearing assessment performed today.  - Advised the patient to begin using AYR or Neilmed sinus rinses BID followed by flonase BID (one spray to each nostril). Advised that the patient may also incorporate allegra or claritin PRN.   - Discussed that Flonase may cause nosebleeds, especially in drier weather. - Encouraged patient to use a humidifier during the winter.  - If symptoms do not improve in two months after use of sinus rinses, discussed need for further assessment.  Reviewed that the anatomy of the ear itself may be contributing to ear pressure.  - If needed, will refer to Ear Nose and Throat in the future.  - Patient agrees to monitor herself at home for when her hearing is poor, to identify if it's worse with more ambient noise, in certain situations, etc.  Recommendations - Need for CPE and full fasting lab work in near future.   Meds ordered this encounter  Medications  . fluticasone (FLONASE) 50 MCG/ACT nasal spray    Sig: 1 spray each nostril following sinus rinses twice daily    Dispense:  16 g    Refill:  2   Gross side effects, risk and benefits, and alternatives of medications and treatment plan in general discussed with patient.  Patient is aware that all medications have potential side effects and we are unable to predict every side effect or drug-drug interaction that may occur.   Patient will call with any questions prior to using medication if they have concerns.    Expresses verbal understanding and consents to current therapy and treatment regimen.   No barriers to understanding were identified.  Red flag symptoms and signs discussed in detail.  Patient expressed understanding regarding what to do in case of emergency\urgent symptoms  Please see AVS handed out to patient at the end of our visit for further patient instructions/ counseling done pertaining to today's office visit.   Return for CPE and full fasting lab work same day in early, mid-April.     Note:  This note was prepared with assistance of Dragon voice recognition software. Occasional wrong-word or sound-a-like substitutions may have occurred due to the inherent limitations of voice recognition software.   This document serves as a record of services personally performed by Thomasene Lot, DO. It was created on her behalf by Peggye Fothergill, a trained medical scribe. The creation of this record is based on the scribe's personal observations and the provider's statements to them.   This case required medical decision making of at least moderate complexity. The above documentation from Peggye Fothergill, medical scribe, has been reviewed by Carlye Grippe, D.O.      --------------------------------------------------------------------------------------------------------------------------------------------------------------------------------------------------------------------------------------------    Subjective:     HPI: Yolanda Coleman is a 39 y.o. female who presents to [facility] today for issues as discussed below.  - Hearing Concerns Notes "family wise, we tend to lose our hearing."    Notes that her husband has said for a while that she can't hear him, because she's always asking him "huh, what?"  Notes that the hearing change  has been more gradual, not sudden.  Regarding ear wax, states "I don't feel like I ever have a lot of wax."  Notes she sometimes cleans her ears with Q-tips; "but I'm not like a regular, regular  person."  Says "I was wondering if getting them cleaned would help.  I don't feel like I have super loss of hearing yet."  Notes had a friend that had their ears cleaned recently and "it was like night and day."  Did not go to a lot of big concerts when she was younger.  Denies exposure to loud sounds.  Patient does feel that the TV has to be turned up a little louder, "because I can't hear."  Denies known family history of early hearing loss due to genetic hearing disorders.  Notes she does tend to get frontal headaches; "not often or anything that I've noticed."    Wt Readings from Last 3 Encounters:  03/24/19 151 lb 1.6 oz (68.5 kg)  12/21/17 151 lb (68.5 kg)  07/28/11 160 lb (72.6 kg)   BP Readings from Last 3 Encounters:  03/24/19 112/78  12/21/17 106/71  07/28/11 107/68   Pulse Readings from Last 3 Encounters:  03/24/19 79  12/21/17 66  07/28/11 75   BMI Readings from Last 3 Encounters:  03/24/19 23.67 kg/m  12/21/17 23.65 kg/m  07/28/11 26.22 kg/m     Patient Care Team    Relationship Specialty Notifications Start End  Mellody Dance, DO PCP - General Family Medicine  12/21/17   Lady Gary, [facility]    12/21/17      Patient Active Problem List   Diagnosis Date Noted  . Hearing deficit, bilateral 03/24/2019  . ETD (Eustachian tube dysfunction), bilateral 03/24/2019  . Tympanic membrane irritation/ buldge, bilateral 03/24/2019  . Dyspareunia in female 12/02/2018  . Encounter to establish care with new doctor 12/21/2017  . Family history of lung cancer 12/21/2017  . Family history of valvular heart disease 12/21/2017  . Family history of high cholesterol 12/21/2017  . Family history of thyroid disease 12/21/2017  . Family history of stroke or transient ischemic attack in mother 12/21/2017  . Current nicotine use- vapes currently -  quit cigarettes 3 years ago and prior had only a 5-pack-year history 12/21/2017    Past Medical history,  Surgical history, Family history, Social history, Allergies and Medications have been entered into the medical record, reviewed and changed as needed.    Current Meds  Medication Sig  . valACYclovir (VALTREX) 500 MG tablet     Allergies:  Allergies  Allergen Reactions  . Penicillins      Review of Systems:  A fourteen system review of systems was performed and found to be positive as per HPI.   Objective:   Blood pressure 112/78, pulse 79, temperature 98.5 F (36.9 C), temperature source Oral, resp. rate 12, height 5\' 7"  (1.702 m), weight 151 lb 1.6 oz (68.5 kg), last menstrual period 03/24/2019, SpO2 100 %. Body mass index is 23.67 kg/m. General:  Well Developed, well nourished, appropriate for stated age.  Neuro:  Alert and oriented,  extra-ocular muscles intact  HEENT:  Normocephalic, atraumatic, neck supple, no carotid bruits appreciated  Ears:  slight bulge of the TM's bilaterally without air fluid levels, effusion, erythema, or other abnormality appreciated. Skin:  no gross rash, warm, pink. Cardiac:  RRR, S1 S2 Respiratory:  ECTA B/L and A/P, Not using accessory muscles, speaking in full sentences- unlabored. Vascular:  Ext warm, no cyanosis  apprec.; cap RF less 2 sec. Psych:  No HI/SI, judgement and insight good, Euthymic mood. Full Affect.

## 2019-10-12 ENCOUNTER — Other Ambulatory Visit: Payer: Self-pay

## 2019-10-12 ENCOUNTER — Ambulatory Visit
Admission: EM | Admit: 2019-10-12 | Discharge: 2019-10-12 | Disposition: A | Discharge status: Home / Self Care | Payer: No Typology Code available for payment source | Attending: Family Medicine | Admitting: Family Medicine

## 2019-10-12 DIAGNOSIS — Z20822 Contact with and (suspected) exposure to covid-19: Secondary | ICD-10-CM

## 2019-10-12 NOTE — ED Triage Notes (Signed)
Pt presents for covid test. Denies symptoms. Had close exposure x 3 days ago. Educated on return precautions.  

## 2019-10-12 NOTE — Discharge Instructions (Signed)

## 2019-10-14 LAB — SARS-COV-2, NAA 2 DAY TAT

## 2019-10-14 LAB — NOVEL CORONAVIRUS, NAA: SARS-CoV-2, NAA: NOT DETECTED

## 2019-10-15 ENCOUNTER — Encounter: Payer: Self-pay | Admitting: Physician Assistant

## 2020-01-18 ENCOUNTER — Ambulatory Visit: Payer: No Typology Code available for payment source

## 2020-01-23 ENCOUNTER — Ambulatory Visit: Payer: No Typology Code available for payment source | Attending: Internal Medicine

## 2020-01-23 DIAGNOSIS — Z23 Encounter for immunization: Secondary | ICD-10-CM

## 2020-01-23 NOTE — Progress Notes (Signed)
   Covid-19 Vaccination Clinic  Name:  Yolanda Coleman    MRN: [id number] DOB: [birth year]  01/23/2020  [name] was observed post Covid-19 immunization for 15 minutes without incident. She was provided with Vaccine Information Sheet and instruction to access the V-Safe system.   [name] was instructed to call 911 with any severe reactions post vaccine: Marland Kitchen Difficulty breathing  . Swelling of face and throat  . A fast heartbeat  . A bad rash all over body  . Dizziness and weakness   Immunizations Administered    Name Date Dose VIS Date Route   Pfizer COVID-19 Vaccine 01/23/2020  2:01 PM 0.3 mL 12/06/2019 Intramuscular   Manufacturer: [employer], [employer]   Lot: [id number]   NDC: [address]-2

## 2020-02-07 ENCOUNTER — Ambulatory Visit: Payer: No Typology Code available for payment source

## 2020-08-07 ENCOUNTER — Telehealth: Payer: Self-pay | Admitting: Physician Assistant

## 2020-08-07 NOTE — Telephone Encounter (Signed)
Reviewed this patient's chart. Is she a healthcare provider? She is very low risk for adverse events from COVID 19 and if vaccinated, she really doesn't meet qualifications for the anti viral medication or the monoclonal antibody treatment. For now, I recommend rest, fluids, over the counter medications to treat symptoms, zinc, vitamin d, and vitamin c every day. She should advise [place] if symptoms worsen or do not get any better over the next few days. Thanks.

## 2020-08-07 NOTE — Telephone Encounter (Signed)
Patient left a voicemail stating she has COVID and would like to know her treatment options. Please advise, thanks.

## 2020-08-07 NOTE — Telephone Encounter (Signed)
Called patient to discuss her symptoms. She tested positive for COVID on 08/06/2020. She is experiencing the following symptoms; cough, congestion, fatigue, low grade fever, abdominal cramping, and diarrhea. Denies having shortness of breath.

## 2020-08-08 NOTE — Telephone Encounter (Signed)
Spoke with patient to give treatment plan for her symptoms. She gave verbal understanding of treatment and will call back if symptoms fail to improve.

## 2024-01-24 ENCOUNTER — Encounter: Payer: Self-pay | Admitting: Pediatrics

## 2024-02-19 ENCOUNTER — Emergency Department (HOSPITAL_BASED_OUTPATIENT_CLINIC_OR_DEPARTMENT_OTHER)
Admission: EM | Admit: 2024-02-19 | Discharge: 2024-02-19 | Disposition: A | Discharge status: Home / Self Care | Payer: PRIVATE HEALTH INSURANCE | Attending: Emergency Medicine | Admitting: Emergency Medicine

## 2024-02-19 ENCOUNTER — Other Ambulatory Visit: Payer: Self-pay

## 2024-02-19 ENCOUNTER — Emergency Department (HOSPITAL_BASED_OUTPATIENT_CLINIC_OR_DEPARTMENT_OTHER): Payer: PRIVATE HEALTH INSURANCE

## 2024-02-19 ENCOUNTER — Encounter (HOSPITAL_BASED_OUTPATIENT_CLINIC_OR_DEPARTMENT_OTHER): Payer: Self-pay

## 2024-02-19 DIAGNOSIS — I10 Essential (primary) hypertension: Secondary | ICD-10-CM | POA: Diagnosis not present

## 2024-02-19 DIAGNOSIS — R1013 Epigastric pain: Secondary | ICD-10-CM | POA: Diagnosis present

## 2024-02-19 DIAGNOSIS — Z79899 Other long term (current) drug therapy: Secondary | ICD-10-CM | POA: Insufficient documentation

## 2024-02-19 DIAGNOSIS — R109 Unspecified abdominal pain: Secondary | ICD-10-CM

## 2024-02-19 DIAGNOSIS — R0789 Other chest pain: Secondary | ICD-10-CM | POA: Diagnosis not present

## 2024-02-19 LAB — LIPASE, BLOOD: Lipase: 41 U/L (ref 11–51)

## 2024-02-19 LAB — CBC WITH DIFFERENTIAL/PLATELET
Abs Immature Granulocytes: 0.03 K/uL (ref 0.00–0.07)
Basophils Absolute: 0 K/uL (ref 0.0–0.1)
Basophils Relative: 1 %
Eosinophils Absolute: 0.1 K/uL (ref 0.0–0.5)
Eosinophils Relative: 2 %
HCT: 40.7 % (ref 36.0–46.0)
Hemoglobin: 13.2 g/dL (ref 12.0–15.0)
Immature Granulocytes: 1 %
Lymphocytes Relative: 25 %
Lymphs Abs: 1.6 K/uL (ref 0.7–4.0)
MCH: 27.6 pg (ref 26.0–34.0)
MCHC: 32.4 g/dL (ref 30.0–36.0)
MCV: 85 fL (ref 80.0–100.0)
Monocytes Absolute: 0.4 K/uL (ref 0.1–1.0)
Monocytes Relative: 6 %
Neutro Abs: 4.3 K/uL (ref 1.7–7.7)
Neutrophils Relative %: 65 %
Platelets: 338 K/uL (ref 150–400)
RBC: 4.79 MIL/uL (ref 3.87–5.11)
RDW: 12.9 % (ref 11.5–15.5)
WBC: 6.4 K/uL (ref 4.0–10.5)
nRBC: 0 % (ref 0.0–0.2)

## 2024-02-19 LAB — BASIC METABOLIC PANEL WITH GFR
Anion gap: 10 (ref 5–15)
BUN: 14 mg/dL (ref 6–20)
CO2: 25 mmol/L (ref 22–32)
Calcium: 9.7 mg/dL (ref 8.9–10.3)
Chloride: 103 mmol/L (ref 98–111)
Creatinine, Ser: 0.74 mg/dL (ref 0.44–1.00)
GFR, Estimated: >60 mL/min
Glucose, Bld: 97 mg/dL (ref 70–99)
Potassium: 3.6 mmol/L (ref 3.5–5.1)
Sodium: 139 mmol/L (ref 135–145)

## 2024-02-19 LAB — URINALYSIS, ROUTINE W REFLEX MICROSCOPIC
Bilirubin Urine: NEGATIVE
Glucose, UA: NEGATIVE mg/dL
Hgb urine dipstick: NEGATIVE
Ketones, ur: NEGATIVE mg/dL
Leukocytes,Ua: NEGATIVE
Nitrite: NEGATIVE
Protein, ur: NEGATIVE mg/dL
Specific Gravity, Urine: 1.03 (ref 1.005–1.030)
pH: 5.5 (ref 5.0–8.0)

## 2024-02-19 LAB — HEPATIC FUNCTION PANEL
ALT: 12 U/L (ref 0–44)
AST: 18 U/L (ref 15–41)
Albumin: 4.5 g/dL (ref 3.5–5.0)
Alkaline Phosphatase: 53 U/L (ref 38–126)
Bilirubin, Direct: 0.1 mg/dL (ref 0.0–0.2)
Indirect Bilirubin: 0.2 mg/dL — ABNORMAL LOW (ref 0.3–0.9)
Total Bilirubin: 0.3 mg/dL (ref 0.0–1.2)
Total Protein: 8 g/dL (ref 6.5–8.1)

## 2024-02-19 LAB — TROPONIN T, HIGH SENSITIVITY: Troponin T High Sensitivity: <15 ng/L (ref 0–19)

## 2024-02-19 LAB — PREGNANCY, URINE: Preg Test, Ur: NEGATIVE

## 2024-02-19 LAB — D-DIMER, QUANTITATIVE: D-Dimer, Quant: 0.76 ug{FEU}/mL — ABNORMAL HIGH (ref 0.00–0.50)

## 2024-02-19 MED ORDER — ALUM & MAG HYDROXIDE-SIMETH 200-200-20 MG/5ML PO SUSP
30.0000 mL | Freq: Once | ORAL | Status: AC
  Administered 2024-02-19: 30 mL via ORAL
  Filled 2024-02-19: qty 30

## 2024-02-19 MED ORDER — LIDOCAINE VISCOUS HCL 2 % MT SOLN
15.0000 mL | Freq: Once | OROMUCOSAL | Status: AC
  Administered 2024-02-19: 15 mL via ORAL
  Filled 2024-02-19: qty 15

## 2024-02-19 MED ORDER — IOHEXOL 350 MG/ML SOLN
75.0000 mL | Freq: Once | INTRAVENOUS | Status: AC | PRN
  Administered 2024-02-19: 60 mL via INTRAVENOUS

## 2024-02-19 MED ORDER — OMEPRAZOLE 20 MG PO CPDR: 20.0000 mg | DELAYED_RELEASE_CAPSULE | Freq: Every day | ORAL | 0 refills | Status: AC

## 2024-02-19 NOTE — ED Provider Notes (Signed)
 "  EMERGENCY DEPARTMENT AT [facility] Provider Note   CSN: [id number] Arrival date & time: 02/19/24  1015     Patient presents with: No chief complaint on file.   Yolanda Coleman is a 43 y.o. female with no past medical history, who presents with chest/epigastric pain ongoing for 4 days.  The pain is described as sharp, constant, and located in the epigastric region/right costal margin with wax/wane radiation into her back. The patient denies fever, chills, shortness of breath, persistent vomiting, hematemesis, melena, hematochezia, dysuria, hematuria. There is no history of recent abdominal trauma or prior abdominal surgery.  The patient reports no recent travel, antibiotic use, or known sick contacts.  Patient denies any recent illness or history of chronic pulmonary disorders. Patient states she has a history of hypertension however denies any other cardiac history.  Patient states that she does vape daily.  Oral intake has been normal and bowel movements are regular, with her last bowel movement yesterday.  Patient was seen at urgent care yesterday and given IM Toradol without relief.  Provider at urgent care stated that patient should be seen in the emergency department for evaluation of a PE.  No alleviating or exacerbating factors were clearly identified.  The patient is in no acute distress.    HPI     Prior to Admission medications  Medication Sig Start Date End Date Taking? Authorizing Provider  omeprazole  (PRILOSEC) 20 MG capsule Take 1 capsule (20 mg total) by mouth daily. 02/19/24 03/04/24 Yes Otis Portal L, PA  fluticasone  (FLONASE ) 50 MCG/ACT nasal spray 1 spray each nostril following sinus rinses twice daily 03/24/19   Midge Sober, DO  valACYclovir (VALTREX) 500 MG tablet  11/24/17   [provider]    Allergies: Penicillins    Review of Systems  Cardiovascular:  Positive for chest pain.  Gastrointestinal:        Epigastric pain     Updated Vital Signs BP 98/74 (BP Location: Right Arm)   Pulse 64   Temp 98.3 F (36.8 C) (Oral)   Resp 16   LMP 01/18/2024 (Approximate)   SpO2 96%   Physical Exam Vitals and nursing note reviewed.  Constitutional:      General: She is awake. She is not in acute distress.    Appearance: Normal appearance.  HENT:     Head: Normocephalic and atraumatic.     Right Ear: Hearing normal.     Left Ear: Hearing normal.  Eyes:     General: Lids are normal. Vision grossly intact.     Extraocular Movements: Extraocular movements intact.     Pupils: Pupils are equal, round, and reactive to light.  Cardiovascular:     Rate and Rhythm: Normal rate and regular rhythm.     Pulses: Normal pulses.          Radial pulses are 2+ on the right side.     Heart sounds: Normal heart sounds.  Pulmonary:     Effort: Pulmonary effort is normal. No respiratory distress.     Breath sounds: Normal breath sounds. No decreased air movement. No wheezing.     Comments: Patient has no difficulty speaking in complete sentences.  Abdominal:     General: Abdomen is flat.     Palpations: Abdomen is soft.     Tenderness: There is abdominal tenderness in the epigastric area.     Comments: She endorses epigastric pain on palpation. Mild tenderness to right costal margin. No CVA tenderness,  guarding, rebound, or distention.  No hernias appreciated on exam.  Musculoskeletal:        General: Normal range of motion.     Cervical back: Full passive range of motion without pain and normal range of motion. No spinous process tenderness.     Right lower leg: No edema.     Left lower leg: No edema.  Skin:    General: Skin is warm and dry.     Capillary Refill: Capillary refill takes less than 2 seconds.  Neurological:     General: No focal deficit present.     Mental Status: She is alert. Mental status is at baseline.  Psychiatric:        Attention and Perception: Attention normal.        Mood and Affect: Mood  normal.        Speech: Speech normal.     (all labs ordered are listed, but only abnormal results are displayed) Labs Reviewed  HEPATIC FUNCTION PANEL - Abnormal; Notable for the following components:      Result Value   Indirect Bilirubin 0.2 (*)    All other components within normal limits  D-DIMER, QUANTITATIVE - Abnormal; Notable for the following components:   D-Dimer, Quant 0.76 (*)    All other components within normal limits  URINALYSIS, ROUTINE W REFLEX MICROSCOPIC - Abnormal; Notable for the following components:   Bacteria, UA RARE (*)    All other components within normal limits  LIPASE, BLOOD  CBC WITH DIFFERENTIAL/PLATELET  BASIC METABOLIC PANEL WITH GFR  PREGNANCY, URINE  TROPONIN T, HIGH SENSITIVITY    EKG: EKG Interpretation Date/Time:  Saturday February 19 2024 10:24:07 EST Ventricular Rate:  74 PR Interval:  142 QRS Duration:  83 QT Interval:  381 QTC Calculation: 423 R Axis:   58  Text Interpretation: Sinus rhythm No previous ECGs available Confirmed by Dreama Longs ([id number]) on 02/19/2024 1:07:52 PM  Radiology: No results found.    Procedures   Medications Ordered in the ED  alum & mag hydroxide-simeth (MAALOX/MYLANTA) 200-200-20 MG/5ML suspension 30 mL (30 mLs Oral Given 02/19/24 1151)    And  lidocaine  (XYLOCAINE ) 2 % viscous mouth solution 15 mL (15 mLs Oral Given 02/19/24 1151)  iohexol  (OMNIPAQUE ) 350 MG/ML injection 75 mL (60 mLs Intravenous Contrast Given 02/19/24 1208)                                  Medical Decision Making Amount and/or Complexity of Data Reviewed Labs: ordered. Decision-making details documented in ED Course. Radiology: ordered. ECG/medicine tests:  Decision-making details documented in ED Course.  Risk OTC drugs. Prescription drug management.   Patient presents to the ED for: Abdominal pain, chest pain This involves an extensive number of treatment options  Differential diagnosis  includes: Gastritis Pancreatitis Cardiac etiology Costochondritis Infectious etiology Co-morbid conditions: Hypertension  Additional history/records obtained and reviewed: Additional history obtained from  husband who presents as good historian External records from outside source obtained and reviewed including review of recent urgent care visit on 02/18/2024  Clinical Course as of 02/25/24 0832  Sat Feb 19, 2024  1114 Temp: 98.3 F (36.8 C) Afebrile, vital stable, patient no acute distress [ML]  1154 D-dimer, quantitative(!) Elevated 0.76 [ML]  1159 CBC with Differential WNL [ML]  1200 Basic metabolic panel WNL [ML]  1200 EKG 12-Lead Sinus rhythm [ML]  1200 Hepatic function panel(!) No acute finding [ML]  1215 Patient given GI cocktail with noted relief [ML]  1232 Urinalysis, Routine w reflex microscopic -Urine, Clean Catch(!) Unremarkable  [ML]  1232 Pregnancy, urine Negative [ML]  1232 Troponin T, High Sensitivity Negative [ML]  1243 CT Angio Chest PE W and/or Wo Contrast No evidence of PE -small lung nodules identified on CT-Will make patient aware to follow-up with PCP [ML]    Clinical Course User Index [ML] Willma Duwaine CROME, PA    Data Reviewed / Actions Taken: Labs ordered/reviewed with my independent interpretation in ED course above. Imaging ordered/reviewed with my independent interpretation in ED course above. I agree with the radiologists interpretation.  EKG ordered/reviewed with my independent interpretation in ED course above. The patient did not require continuous cardiac monitoring during the ED stay.  Management / Treatments: See ED course above for medications, treatments administered, and clinical rationale.   Reevaluation of the patient after these medicines showed that the patient improved. I have reviewed the patients home medicines and have made adjustments as needed  ED Course / Reassessments: Problem List: Abdominal pain 44 year old female  presented for abdominal pain. Initial assessment included history, physical exam, and review of prior medical records. Laboratory evaluation was overall unremarkable. Imaging demonstrated no acute cardiopulmonary etiology. Based on the patient's reassuring vital signs, benign abdominal exam, and unremarkable diagnostic workup, there is low clinical suspicion for acute surgical abdomen, bowel obstruction, appendicitis, cholecystitis, pancreatitis, ischemic bowel, or other emergent intra-abdominal process. Low suspicion cardiopulmonary etiology given EKG and troponin without ischemic changes, and unremarkable CTA.  Patient's symptoms likely mild gastritis given symptomatic relief with GI cocktail -patient to be started on PPI.  The patient was treated symptomatically with GI cocktail with improvement of symptoms during the ED course.  The patient remained clinically stable and deemed appropriate for outpatient management.  Discharge instructions included strict return precautions for worsening or localized abdominal pain, chest pain, persistent vomiting, fever, inability to tolerate oral intake, syncope, or new concerning symptoms. Patient response: Improved with ED management  Serial reassessments performed: Yes    Disposition: Disposition: Discharge with close follow-up with PCP and gastroenterology at scheduled appointment next week Rationale for disposition: Stable for discharge The disposition plan and rationale were discussed with the patient at the bedside, all questions were addressed, and the patient demonstrated understanding.  This note was produced using Electronics Engineer. While I have reviewed and verified all clinical information, transcription errors may remain.      Final diagnoses:  Abdominal pain, unspecified abdominal location    ED Discharge Orders          Ordered    omeprazole  (PRILOSEC) 20 MG capsule  Daily        02/19/24 1338               Willma Duwaine CROME, [place] 02/25/24 [id number]    Dreama Longs, MD 02/25/24 1254  "

## 2024-02-19 NOTE — ED Triage Notes (Signed)
 She reports luq abd./lower chest discomfort for 3-4 days. Has seen urgent care and underwent a teledoc visit. She cites recent Partying for New Years, however, she states she rarely drinks alcohol.

## 2024-02-19 NOTE — ED Notes (Signed)
 DC paperwork given and verbally understood.

## 2024-02-19 NOTE — ED Notes (Signed)
 Lab notified of blood tests not yet received. They confirm they have the blood and will begin testing now.

## 2024-02-19 NOTE — Discharge Instructions (Addendum)
 Thank you for visiting the Emergency Department today. It was a pleasure to be part of your healthcare team.   Your were seen today for upper abdominal pain, and workup today was overall reassuring.    As discussed, continue to rest, hydrate, and resume diet as normal.  You have prescribed omeprazole  to take daily for epigastric pain/discomfort, and you should take your medications as directed. If you have any questions about your medicines, please call your pharmacy or healthcare provider.  It is important to watch for warning signs such as worsening pain, fever, trouble breathing, or chest pain. If any of these happen, return to the Emergency Department or call 911.  Please follow up with your primary care provider within 2 weeks for further evaluation.   Thank you for trusting us  with your health.

## 2024-02-24 ENCOUNTER — Other Ambulatory Visit: Payer: Self-pay | Admitting: Internal Medicine

## 2024-02-24 DIAGNOSIS — R1012 Left upper quadrant pain: Secondary | ICD-10-CM

## 2024-02-24 DIAGNOSIS — R0789 Other chest pain: Secondary | ICD-10-CM

## 2024-02-25 ENCOUNTER — Ambulatory Visit: Payer: Self-pay | Admitting: Gastroenterology

## 2024-03-01 ENCOUNTER — Ambulatory Visit: Admitting: Pediatrics
